# Patient Record
Sex: Female | Born: 1975 | Race: White | Hispanic: No | Marital: Married | Smoking: Never smoker
Health system: Southern US, Community
[De-identification: ages and names within clinical notes are randomized; demographics above are authoritative.]

## PROBLEM LIST (undated history)

## (undated) DIAGNOSIS — S61219A Laceration without foreign body of unspecified finger without damage to nail, initial encounter: Secondary | ICD-10-CM

## (undated) DIAGNOSIS — K219 Gastro-esophageal reflux disease without esophagitis: Secondary | ICD-10-CM

## (undated) HISTORY — PX: WISDOM TOOTH EXTRACTION: SHX21

---

## 2017-04-06 ENCOUNTER — Other Ambulatory Visit: Payer: Self-pay | Admitting: Obstetrics & Gynecology

## 2017-04-06 DIAGNOSIS — Z1231 Encounter for screening mammogram for malignant neoplasm of breast: Secondary | ICD-10-CM

## 2017-04-27 ENCOUNTER — Ambulatory Visit
Admission: RE | Admit: 2017-04-27 | Discharge: 2017-04-27 | Disposition: A | Discharge status: Home / Self Care | Payer: No Typology Code available for payment source | Source: Ambulatory Visit | Attending: Obstetrics & Gynecology | Admitting: Obstetrics & Gynecology

## 2017-04-27 DIAGNOSIS — Z1231 Encounter for screening mammogram for malignant neoplasm of breast: Secondary | ICD-10-CM

## 2018-05-31 ENCOUNTER — Other Ambulatory Visit: Payer: Self-pay | Admitting: Obstetrics & Gynecology

## 2018-05-31 DIAGNOSIS — Z1231 Encounter for screening mammogram for malignant neoplasm of breast: Secondary | ICD-10-CM

## 2018-06-11 ENCOUNTER — Other Ambulatory Visit: Payer: Self-pay

## 2018-06-11 ENCOUNTER — Ambulatory Visit
Admission: RE | Admit: 2018-06-11 | Discharge: 2018-06-11 | Disposition: A | Discharge status: Home / Self Care | Payer: No Typology Code available for payment source | Source: Ambulatory Visit | Attending: Obstetrics & Gynecology | Admitting: Obstetrics & Gynecology

## 2018-06-11 DIAGNOSIS — Z1231 Encounter for screening mammogram for malignant neoplasm of breast: Secondary | ICD-10-CM

## 2018-06-12 ENCOUNTER — Other Ambulatory Visit: Payer: Self-pay | Admitting: Obstetrics & Gynecology

## 2018-06-12 DIAGNOSIS — R928 Other abnormal and inconclusive findings on diagnostic imaging of breast: Secondary | ICD-10-CM

## 2018-06-13 ENCOUNTER — Ambulatory Visit
Admission: RE | Admit: 2018-06-13 | Discharge: 2018-06-13 | Disposition: A | Discharge status: Home / Self Care | Payer: No Typology Code available for payment source | Source: Ambulatory Visit | Attending: Obstetrics & Gynecology | Admitting: Obstetrics & Gynecology

## 2018-06-13 ENCOUNTER — Other Ambulatory Visit: Payer: Self-pay

## 2018-06-13 ENCOUNTER — Other Ambulatory Visit: Payer: Self-pay | Admitting: Obstetrics & Gynecology

## 2018-06-13 DIAGNOSIS — N6489 Other specified disorders of breast: Secondary | ICD-10-CM

## 2018-06-13 DIAGNOSIS — R928 Other abnormal and inconclusive findings on diagnostic imaging of breast: Secondary | ICD-10-CM

## 2018-06-17 ENCOUNTER — Other Ambulatory Visit: Payer: Self-pay

## 2018-06-17 ENCOUNTER — Ambulatory Visit
Admission: RE | Admit: 2018-06-17 | Discharge: 2018-06-17 | Disposition: A | Discharge status: Home / Self Care | Payer: No Typology Code available for payment source | Source: Ambulatory Visit | Attending: Obstetrics & Gynecology | Admitting: Obstetrics & Gynecology

## 2018-06-17 DIAGNOSIS — N6489 Other specified disorders of breast: Secondary | ICD-10-CM

## 2020-07-26 IMAGING — MG DIGITAL SCREENING BILATERAL MAMMOGRAM WITH TOMO AND CAD
8 series · 8 of 24 positions shown · non-contrast
Comparison: Previous exam(s).

CLINICAL DATA: Screening.

EXAM:
DIGITAL SCREENING BILATERAL MAMMOGRAM WITH TOMO AND CAD

[L CC synth-2D]
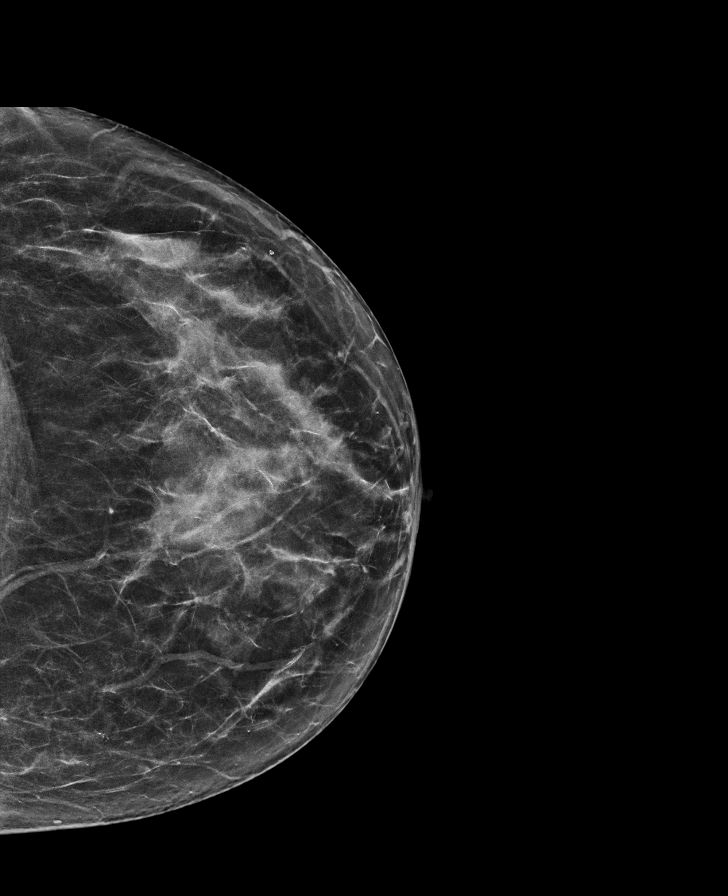

[L MLO synth-2D]
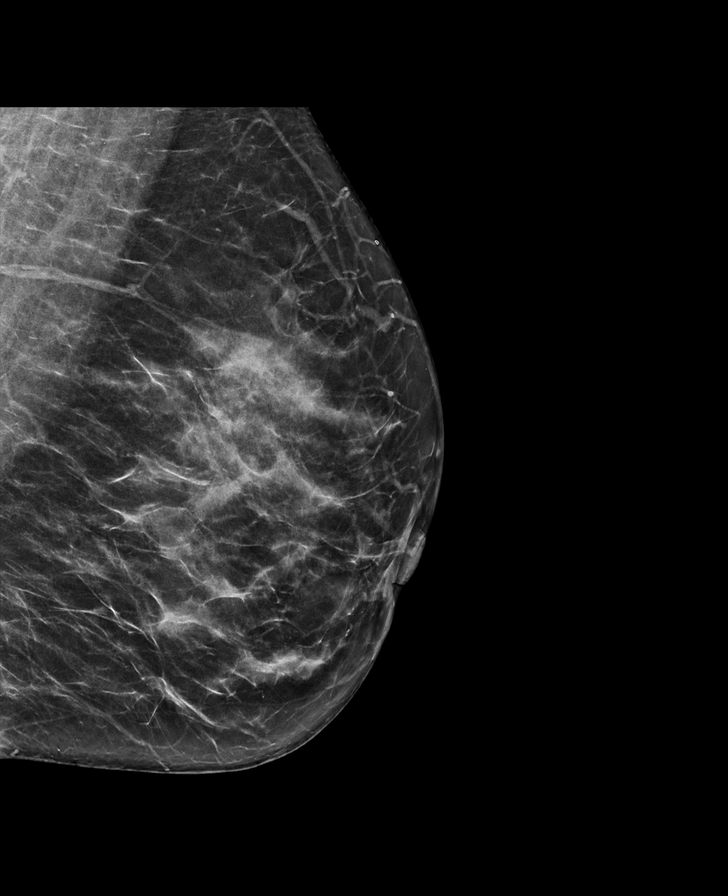

[R MLO synth-2D]
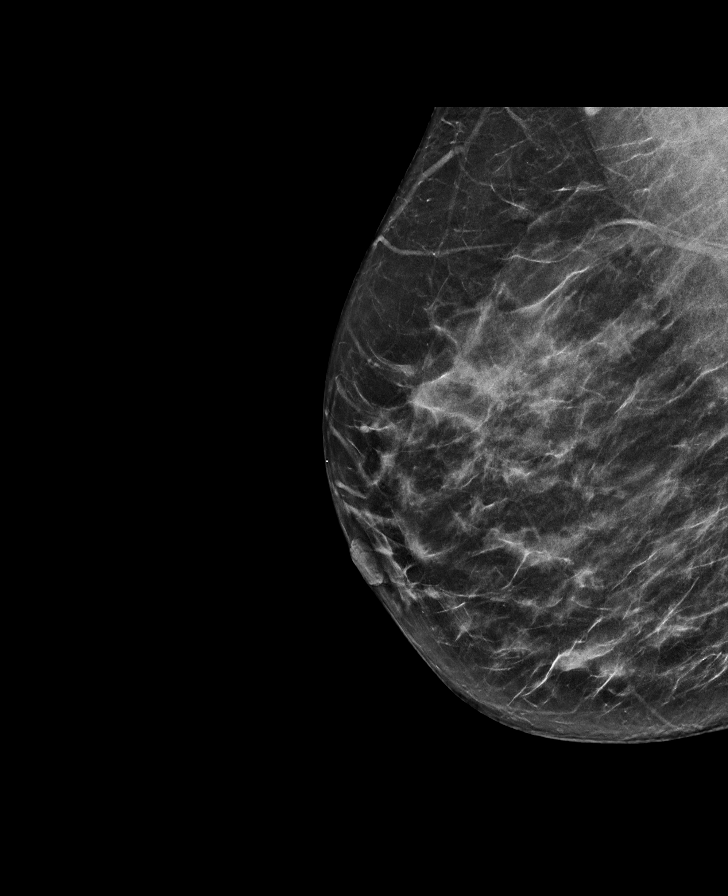

[R CC synth-2D]
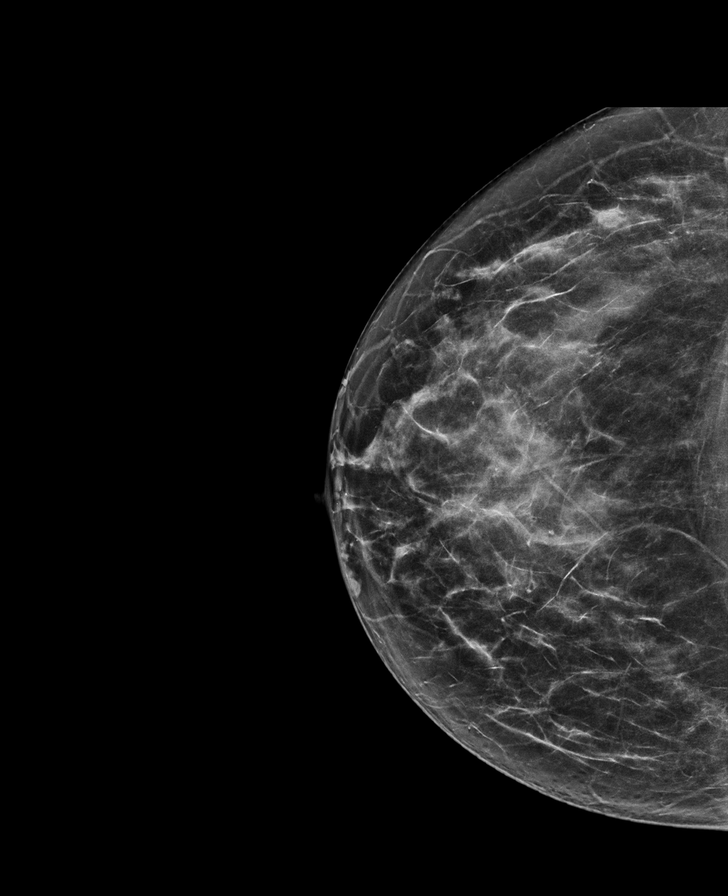

[R CC tomo · tomo slice 35/68.0]
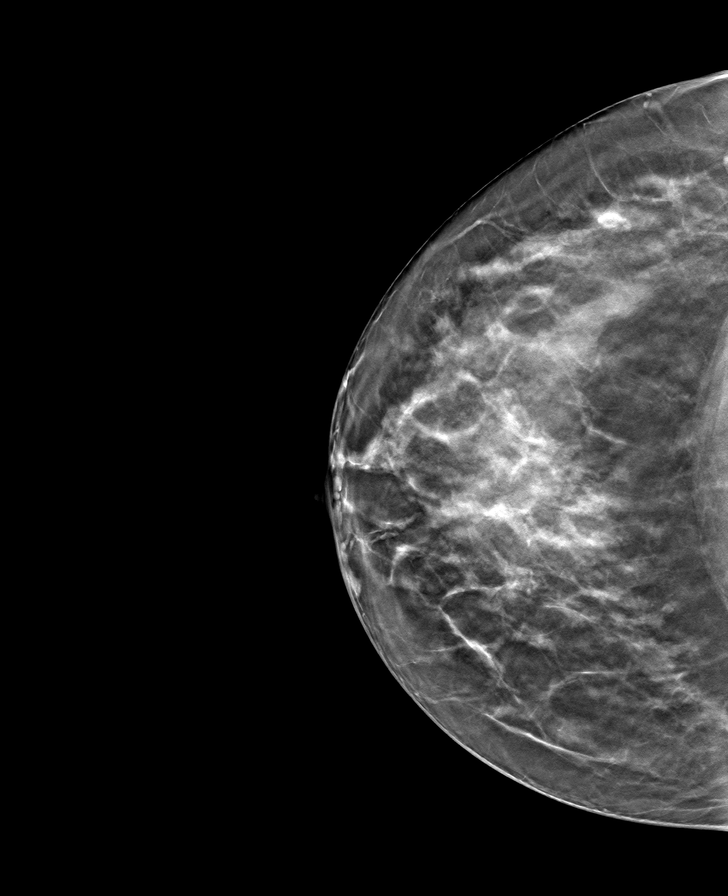

[L MLO tomo · tomo slice 33/65.0]
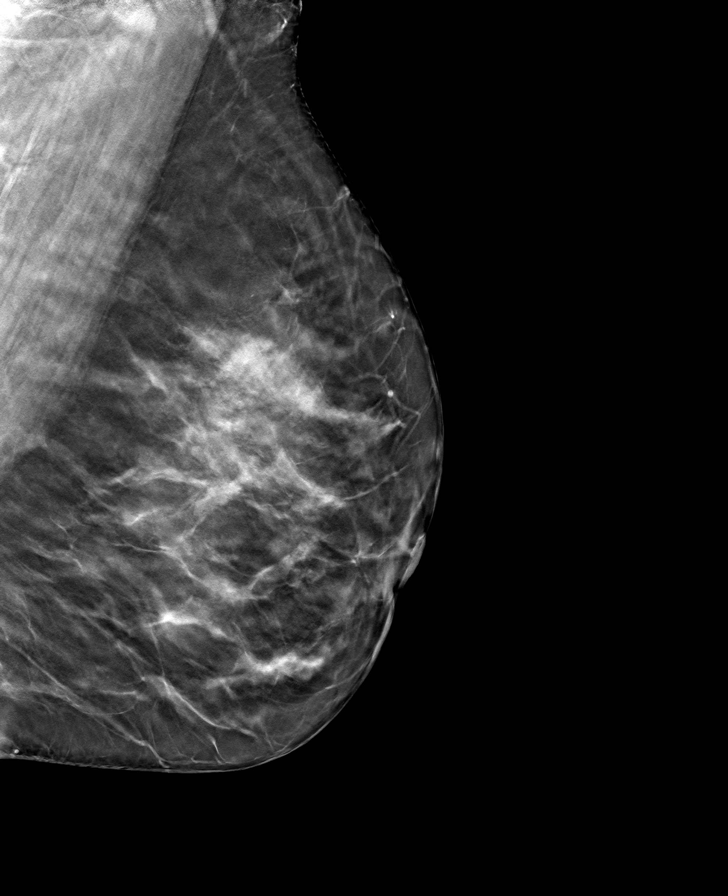

[R MLO tomo · tomo slice 34/67.0]
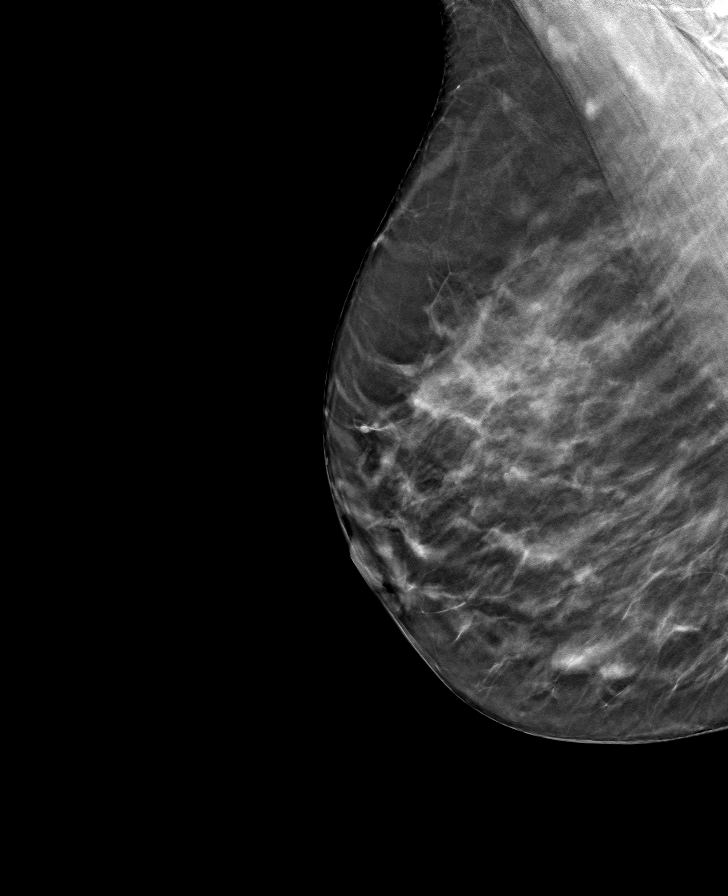

[L CC tomo · tomo slice 34/67.0]
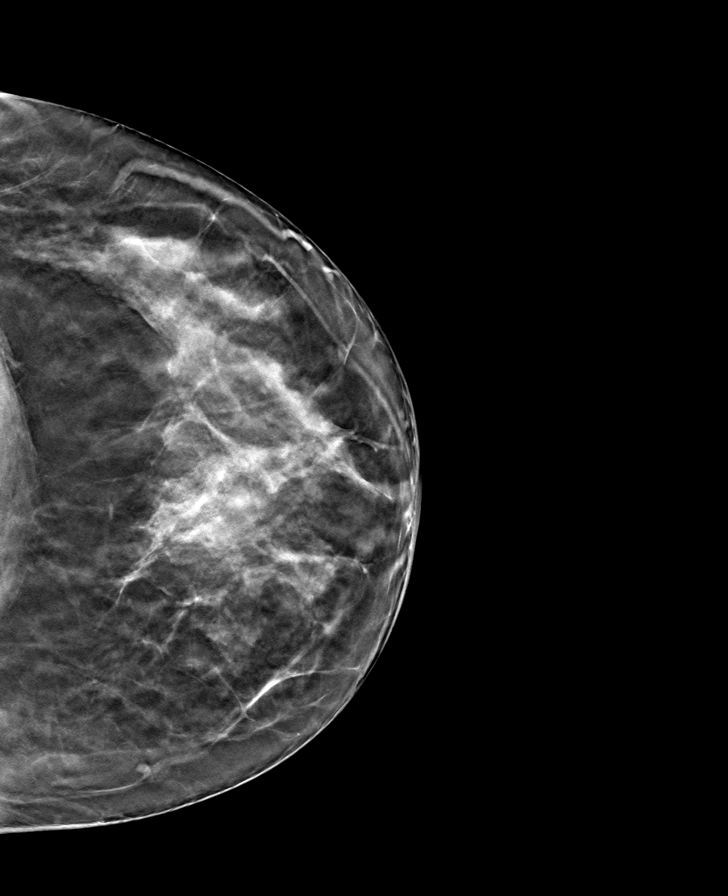

[8 of 24 positions shown; findings below may reference images not displayed]

ACR Breast Density Category c: The breast tissue is heterogeneously
dense, which may obscure small masses.
FINDINGS: In the left breast, possible distortion warrants further evaluation.
In the right breast, no findings suspicious for malignancy. Images
were processed with CAD.
IMPRESSION: Further evaluation is suggested for possible distortion in the left
breast.

RECOMMENDATION:
Diagnostic mammogram and possibly ultrasound of the left breast.
(Code:[id number])

The patient will be contacted regarding the findings, and additional
imaging will be scheduled.

BI-RADS CATEGORY  0: Incomplete. Need additional imaging evaluation
and/or prior mammograms for comparison.

## 2022-06-24 ENCOUNTER — Ambulatory Visit
Admission: RE | Admit: 2022-06-24 | Discharge: 2022-06-24 | Disposition: A | Discharge status: Home / Self Care | Payer: No Typology Code available for payment source | Source: Ambulatory Visit | Attending: Physician Assistant | Admitting: Physician Assistant

## 2022-06-24 VITALS — BP 131/82 | HR 92 | Temp 98.2°F | Resp 18 | Wt 173.0 lb

## 2022-06-24 DIAGNOSIS — Z2839 Other underimmunization status: Secondary | ICD-10-CM

## 2022-06-24 DIAGNOSIS — S61216A Laceration without foreign body of right little finger without damage to nail, initial encounter: Secondary | ICD-10-CM

## 2022-06-24 DIAGNOSIS — Z23 Encounter for immunization: Secondary | ICD-10-CM

## 2022-06-24 MED ORDER — TETANUS-DIPHTH-ACELL PERTUSSIS 5-2.5-18.5 LF-MCG/0.5 IM SUSY
0.5000 mL | PREFILLED_SYRINGE | Freq: Once | INTRAMUSCULAR | Status: AC
  Administered 2022-06-24: 0.5 mL via INTRAMUSCULAR

## 2022-06-24 NOTE — ED Provider Notes (Signed)
[facility]    CSN: [id number] Arrival date & time: 06/24/22  1117      History   Chief Complaint Chief Complaint  Patient presents with   Finger Injury    Need a twtnus shot - Entered by patient    HPI Keller Mikels is a 47 y.o. female presenting for Tdap immunization.  She reports she cut her finger with shears 5 days ago.  Reports that she saw orthopedic surgeon yesterday and is supposed to have an upcoming surgery next Wednesday because she has damage to her nerve of the finger that was lacerated which is the right fifth digit.  Reports she has been keeping the area clean.  She currently has Steri-Strips over the laceration.  She says she is only having any pain and has full range of motion of her finger.  Last tetanus immunization greater than 12 years ago and surgeon advised her to get a tetanus immunization before the surgery.  HPI  History reviewed. No pertinent past medical history.  There are no problems to display for this patient.   History reviewed. No pertinent surgical history.  OB History   No obstetric history on file.      Home Medications    Prior to Admission medications   Not on File    Family History Family History  Problem Relation Age of Onset   Breast cancer Maternal Grandmother 46    Social History Social History   Tobacco Use   Smoking status: Never    Passive exposure: Never   Smokeless tobacco: Never  Substance Use Topics   Alcohol use: Never   Drug use: Never     Allergies   Naproxen and Penicillins   Review of Systems Review of Systems  Musculoskeletal:  Positive for arthralgias. Negative for joint swelling.  Skin:  Positive for wound. Negative for color change.  Neurological:  Positive for numbness. Negative for weakness.     Physical Exam Triage Vital Signs ED Triage Vitals  Enc Vitals Group     BP 06/24/22 1151 131/82     Pulse Rate 06/24/22 1151 92     Resp 06/24/22 1151 18     Temp  06/24/22 1151 98.2 F (36.8 C)     Temp Source 06/24/22 1151 Oral     SpO2 06/24/22 1151 100 %     Weight 06/24/22 1148 173 lb (78.5 kg)     Height --      Head Circumference --      Peak Flow --      Pain Score --      Pain Loc --      Pain Edu? --      Excl. in GC? --    No data found.  Updated Vital Signs BP 131/82 (BP Location: Right Arm)   Pulse 92   Temp 98.2 F (36.8 C) (Oral)   Resp 18   Wt 173 lb (78.5 kg)   SpO2 100%      Physical Exam Vitals and nursing note reviewed.  Constitutional:      General: She is not in acute distress.    Appearance: Normal appearance. She is not ill-appearing or toxic-appearing.  HENT:     Head: Normocephalic and atraumatic.  Eyes:     General: No scleral icterus.       Right eye: No discharge.        Left eye: No discharge.     Conjunctiva/sclera: Conjunctivae normal.  Cardiovascular:  Rate and Rhythm: Normal rate and regular rhythm.     Pulses: Normal pulses.  Pulmonary:     Effort: Pulmonary effort is normal. No respiratory distress.  Musculoskeletal:     Cervical back: Neck supple.     Comments: Right fifth finger: 1.5 cm laceration noted without erythema or drainage.  Full range of motion of finger.  Reduced sensation at tip of finger.  Skin:    General: Skin is dry.  Neurological:     General: No focal deficit present.     Mental Status: She is alert. Mental status is at baseline.     Motor: No weakness.     Gait: Gait normal.  Psychiatric:        Mood and Affect: Mood normal.        Behavior: Behavior normal.        Thought Content: Thought content normal.      UC Treatments / Results  Labs (all labs ordered are listed, but only abnormal results are displayed) Labs Reviewed - No data to display  EKG   Radiology No results found.  Procedures Procedures (including critical care time)  Medications Ordered in UC Medications  Tdap (BOOSTRIX) injection 0.5 mL (0.5 mLs Intramuscular Given 06/24/22  1222)    Initial Impression / Assessment and Plan / UC Course  I have reviewed the triage vital signs and the nursing notes.  Pertinent labs & imaging results that were available during my care of the patient were reviewed by me and considered in my medical decision making (see chart for details).   47 year old female presents for Tdap immunization.  She is following up with orthopedic surgery for surgery of her right fifth finger due to nerve damage from a laceration that she sustained when she cut it with shears 5 days ago.  No signs of infection.  Tetanus immunization given.  Patient advised to continue following up with orthopedic surgery.   Final Clinical Impressions(s) / UC Diagnoses   Final diagnoses:  Not up to date with tetanus toxoid immunization  Laceration of right little finger without foreign body without damage to nail, initial encounter     Discharge Instructions      -Follow up with ortho surgery.    ED Prescriptions   None    PDMP not reviewed this encounter.   Shirlee Latch, PA-C 06/24/22 1247

## 2022-06-24 NOTE — Discharge Instructions (Signed)
Follow up with ortho surgery

## 2022-06-24 NOTE — ED Triage Notes (Signed)
Cut finger on sheers. Pinky on right finger. She goes for surgery next week and dr advised to come get tdap.

## 2022-06-25 ENCOUNTER — Ambulatory Visit: Payer: Self-pay

## 2022-06-30 ENCOUNTER — Encounter (HOSPITAL_BASED_OUTPATIENT_CLINIC_OR_DEPARTMENT_OTHER): Payer: Self-pay | Admitting: Orthopedic Surgery

## 2022-06-30 ENCOUNTER — Other Ambulatory Visit: Payer: Self-pay

## 2022-07-05 ENCOUNTER — Ambulatory Visit (HOSPITAL_BASED_OUTPATIENT_CLINIC_OR_DEPARTMENT_OTHER): Payer: Self-pay | Admitting: Anesthesiology

## 2022-07-05 ENCOUNTER — Encounter (HOSPITAL_BASED_OUTPATIENT_CLINIC_OR_DEPARTMENT_OTHER): Payer: Self-pay | Admitting: Orthopedic Surgery

## 2022-07-05 ENCOUNTER — Ambulatory Visit (HOSPITAL_BASED_OUTPATIENT_CLINIC_OR_DEPARTMENT_OTHER)
Admission: RE | Admit: 2022-07-05 | Discharge: 2022-07-05 | Disposition: A | Discharge status: Home / Self Care | Payer: Self-pay | Attending: Orthopedic Surgery | Admitting: Orthopedic Surgery

## 2022-07-05 ENCOUNTER — Other Ambulatory Visit: Payer: Self-pay

## 2022-07-05 ENCOUNTER — Encounter (HOSPITAL_BASED_OUTPATIENT_CLINIC_OR_DEPARTMENT_OTHER)
Admission: RE | Disposition: A | Discharge status: Home / Self Care | Payer: Self-pay | Source: Home / Self Care | Attending: Orthopedic Surgery

## 2022-07-05 DIAGNOSIS — S61216A Laceration without foreign body of right little finger without damage to nail, initial encounter: Secondary | ICD-10-CM

## 2022-07-05 DIAGNOSIS — X58XXXA Exposure to other specified factors, initial encounter: Secondary | ICD-10-CM | POA: Insufficient documentation

## 2022-07-05 DIAGNOSIS — R202 Paresthesia of skin: Secondary | ICD-10-CM | POA: Insufficient documentation

## 2022-07-05 DIAGNOSIS — R200 Anesthesia of skin: Secondary | ICD-10-CM | POA: Insufficient documentation

## 2022-07-05 DIAGNOSIS — Z01818 Encounter for other preprocedural examination: Secondary | ICD-10-CM

## 2022-07-05 HISTORY — DX: Laceration without foreign body of unspecified finger without damage to nail, initial encounter: S61.219A

## 2022-07-05 HISTORY — DX: Gastro-esophageal reflux disease without esophagitis: K21.9

## 2022-07-05 HISTORY — PX: WOUND EXPLORATION: SHX6188

## 2022-07-05 LAB — POCT PREGNANCY, URINE: Preg Test, Ur: NEGATIVE

## 2022-07-05 SURGERY — WOUND EXPLORATION
Anesthesia: Monitor Anesthesia Care | Site: Finger | Laterality: Right

## 2022-07-05 MED ORDER — 0.9 % SODIUM CHLORIDE (POUR BTL) OPTIME
TOPICAL | Status: DC | PRN
  Administered 2022-07-05: 150 mL

## 2022-07-05 MED ORDER — LACTATED RINGERS IV SOLN: INTRAVENOUS | Status: DC

## 2022-07-05 MED ORDER — BUPIVACAINE HCL (PF) 0.25 % IJ SOLN
INTRAMUSCULAR | Status: DC | PRN
  Administered 2022-07-05: 5 mL

## 2022-07-05 MED ORDER — CEFAZOLIN SODIUM-DEXTROSE 2-4 GM/100ML-% IV SOLN
INTRAVENOUS | Status: AC
  Filled 2022-07-05: qty 100

## 2022-07-05 MED ORDER — FENTANYL CITRATE (PF) 100 MCG/2ML IJ SOLN: 25.0000 ug | INTRAMUSCULAR | Status: DC | PRN

## 2022-07-05 MED ORDER — ACETAMINOPHEN 500 MG PO TABS
1000.0000 mg | ORAL_TABLET | Freq: Once | ORAL | Status: AC
  Administered 2022-07-05: 1000 mg via ORAL

## 2022-07-05 MED ORDER — CEFAZOLIN SODIUM-DEXTROSE 2-4 GM/100ML-% IV SOLN
2.0000 g | INTRAVENOUS | Status: AC
  Administered 2022-07-05: 2 g via INTRAVENOUS

## 2022-07-05 MED ORDER — MIDAZOLAM HCL 2 MG/2ML IJ SOLN
INTRAMUSCULAR | Status: AC
  Filled 2022-07-05: qty 2

## 2022-07-05 MED ORDER — PROPOFOL 10 MG/ML IV BOLUS
INTRAVENOUS | Status: DC | PRN
  Administered 2022-07-05: 20 mg via INTRAVENOUS

## 2022-07-05 MED ORDER — LIDOCAINE HCL (PF) 1 % IJ SOLN
INTRAMUSCULAR | Status: AC
  Filled 2022-07-05: qty 30

## 2022-07-05 MED ORDER — DEXMEDETOMIDINE HCL IN NACL 80 MCG/20ML IV SOLN
INTRAVENOUS | Status: DC | PRN
  Administered 2022-07-05: 8 ug via INTRAVENOUS

## 2022-07-05 MED ORDER — ACETAMINOPHEN 500 MG PO TABS
ORAL_TABLET | ORAL | Status: AC
  Filled 2022-07-05: qty 2

## 2022-07-05 MED ORDER — OXYCODONE HCL 5 MG/5ML PO SOLN: 5.0000 mg | Freq: Once | ORAL | Status: DC | PRN

## 2022-07-05 MED ORDER — AMISULPRIDE (ANTIEMETIC) 5 MG/2ML IV SOLN
10.0000 mg | Freq: Once | INTRAVENOUS | Status: AC | PRN
  Administered 2022-07-05: 10 mg via INTRAVENOUS

## 2022-07-05 MED ORDER — FENTANYL CITRATE (PF) 100 MCG/2ML IJ SOLN
INTRAMUSCULAR | Status: AC
  Filled 2022-07-05: qty 2

## 2022-07-05 MED ORDER — LIDOCAINE HCL 1 % IJ SOLN
INTRAMUSCULAR | Status: DC | PRN
  Administered 2022-07-05: 5 mL

## 2022-07-05 MED ORDER — AMISULPRIDE (ANTIEMETIC) 5 MG/2ML IV SOLN
INTRAVENOUS | Status: AC
  Filled 2022-07-05: qty 4

## 2022-07-05 MED ORDER — FENTANYL CITRATE (PF) 100 MCG/2ML IJ SOLN
INTRAMUSCULAR | Status: DC | PRN
  Administered 2022-07-05 (×2): 25 ug via INTRAVENOUS

## 2022-07-05 MED ORDER — PROPOFOL 500 MG/50ML IV EMUL
INTRAVENOUS | Status: DC | PRN
  Administered 2022-07-05: 75 ug/kg/min via INTRAVENOUS

## 2022-07-05 MED ORDER — OXYCODONE HCL 5 MG PO TABS: 5.0000 mg | ORAL_TABLET | Freq: Four times a day (QID) | ORAL | 0 refills | Status: AC | PRN

## 2022-07-05 MED ORDER — ONDANSETRON HCL 4 MG/2ML IJ SOLN
INTRAMUSCULAR | Status: DC | PRN
  Administered 2022-07-05: 4 mg via INTRAVENOUS

## 2022-07-05 MED ORDER — LIDOCAINE 2% (20 MG/ML) 5 ML SYRINGE
INTRAMUSCULAR | Status: DC | PRN
  Administered 2022-07-05: 2 mg via INTRAVENOUS

## 2022-07-05 MED ORDER — OXYCODONE HCL 5 MG PO TABS: 5.0000 mg | ORAL_TABLET | Freq: Once | ORAL | Status: DC | PRN

## 2022-07-05 SURGICAL SUPPLY — 71 items
APL PRP STRL LF DISP 70% ISPRP (MISCELLANEOUS) ×1
BLADE MINI RND TIP GREEN BEAV (BLADE) IMPLANT
BLADE SURG 15 STRL LF DISP TIS (BLADE) ×2 IMPLANT
BLADE SURG 15 STRL SS (BLADE) ×1
BNDG CMPR 5X2 KNTD ELC UNQ LF (GAUZE/BANDAGES/DRESSINGS)
BNDG CMPR 5X3 KNIT ELC UNQ LF (GAUZE/BANDAGES/DRESSINGS) ×1
BNDG CMPR 9X4 STRL LF SNTH (GAUZE/BANDAGES/DRESSINGS) ×1
BNDG ELASTIC 2INX 5YD STR LF (GAUZE/BANDAGES/DRESSINGS) IMPLANT
BNDG ELASTIC 3INX 5YD STR LF (GAUZE/BANDAGES/DRESSINGS) ×1 IMPLANT
BNDG ESMARK 4X9 LF (GAUZE/BANDAGES/DRESSINGS) ×1 IMPLANT
BNDG GAUZE DERMACEA FLUFF 4 (GAUZE/BANDAGES/DRESSINGS) ×1 IMPLANT
BNDG GZE DERMACEA 4 6PLY (GAUZE/BANDAGES/DRESSINGS) ×1
CATH ROBINSON RED A/P 10FR (CATHETERS) IMPLANT
CHLORAPREP W/TINT 26 (MISCELLANEOUS) ×1 IMPLANT
CORD BIPOLAR FORCEPS 12FT (ELECTRODE) ×1 IMPLANT
COVER BACK TABLE 60X90IN (DRAPES) ×1 IMPLANT
COVER MAYO STAND STRL (DRAPES) ×1 IMPLANT
CUFF TOURN SGL QUICK 18X4 (TOURNIQUET CUFF) ×1 IMPLANT
DRAPE EXTREMITY T 121X128X90 (DISPOSABLE) ×1 IMPLANT
DRAPE OEC MINIVIEW 54X84 (DRAPES) IMPLANT
DRAPE SURG 17X23 STRL (DRAPES) ×1 IMPLANT
GAUZE 4X4 16PLY ~~LOC~~+RFID DBL (SPONGE) IMPLANT
GAUZE PAD ABD 8X10 STRL (GAUZE/BANDAGES/DRESSINGS) IMPLANT
GAUZE XEROFORM 1X8 LF (GAUZE/BANDAGES/DRESSINGS) ×1 IMPLANT
GLOVE BIO SURGEON STRL SZ7 (GLOVE) ×1 IMPLANT
GLOVE BIOGEL PI IND STRL 7.0 (GLOVE) ×1 IMPLANT
GOWN STRL REUS W/ TWL LRG LVL3 (GOWN DISPOSABLE) ×2 IMPLANT
GOWN STRL REUS W/TWL LRG LVL3 (GOWN DISPOSABLE) ×3
LOOP VASCLR MAXI BLUE 18IN ST (MISCELLANEOUS) IMPLANT
LOOP VASCULAR MAXI 18 BLUE (MISCELLANEOUS)
LOOPS VASCLR MAXI BLUE 18IN ST (MISCELLANEOUS) IMPLANT
NDL HYPO 25X1 1.5 SAFETY (NEEDLE) IMPLANT
NDL KEITH (NEEDLE) IMPLANT
NEEDLE HYPO 25X1 1.5 SAFETY (NEEDLE) ×1 IMPLANT
NEEDLE KEITH (NEEDLE) IMPLANT
NS IRRIG 1000ML POUR BTL (IV SOLUTION) ×1 IMPLANT
PACK BASIN DAY SURGERY FS (CUSTOM PROCEDURE TRAY) ×1 IMPLANT
PAD CAST 3X4 CTTN HI CHSV (CAST SUPPLIES) ×1 IMPLANT
PAD CAST 4YDX4 CTTN HI CHSV (CAST SUPPLIES) IMPLANT
PADDING CAST ABS COTTON 3X4 (CAST SUPPLIES) IMPLANT
PADDING CAST ABS COTTON 4X4 ST (CAST SUPPLIES) ×1 IMPLANT
PADDING CAST COTTON 3X4 STRL (CAST SUPPLIES) ×1
PADDING CAST COTTON 4X4 STRL (CAST SUPPLIES)
SHEET MEDIUM DRAPE 40X70 STRL (DRAPES) IMPLANT
SLEEVE SCD COMPRESS KNEE MED (STOCKING) IMPLANT
SPIKE FLUID TRANSFER (MISCELLANEOUS) IMPLANT
SPLINT FIBERGLASS 4X30 (CAST SUPPLIES) ×1 IMPLANT
SPLINT PLASTER CAST XFAST 3X15 (CAST SUPPLIES) IMPLANT
SUT ETHIBOND 3-0 V-5 (SUTURE) IMPLANT
SUT ETHILON 3 0 PS 1 (SUTURE) IMPLANT
SUT ETHILON 4 0 PS 2 18 (SUTURE) ×1 IMPLANT
SUT ETHILON 8 0 BV130 4 (SUTURE) IMPLANT
SUT FIBERWIRE 3-0 18 TAPR NDL (SUTURE)
SUT FIBERWIRE 4-0 18 DIAM BLUE (SUTURE)
SUT FIBERWIRE 4-0 18 TAPR NDL (SUTURE)
SUT MNCRL AB 4-0 PS2 18 (SUTURE) IMPLANT
SUT MON AB 5-0 PS2 18 (SUTURE) IMPLANT
SUT NYLON 9 0 VRM6 (SUTURE) IMPLANT
SUT PROLENE 6 0 P 1 18 (SUTURE) IMPLANT
SUT SILK 2 0 PERMA HAND 18 BK (SUTURE) IMPLANT
SUT SILK 4 0 PS 2 (SUTURE) IMPLANT
SUT SUPRAMID 4-0 (SUTURE) IMPLANT
SUT VIC AB 4-0 PS2 18 (SUTURE) IMPLANT
SUTURE FIBERWR 3-0 18 TAPR NDL (SUTURE) IMPLANT
SUTURE FIBERWR 4-0 18 DIA BLUE (SUTURE) IMPLANT
SUTURE FIBERWR 4-0 18 TAPR NDL (SUTURE) IMPLANT
SYR BULB EAR ULCER 3OZ GRN STR (SYRINGE) ×1 IMPLANT
SYR CONTROL 10ML LL (SYRINGE) IMPLANT
TOWEL GREEN STERILE FF (TOWEL DISPOSABLE) ×2 IMPLANT
UNDERPAD 30X36 HEAVY ABSORB (UNDERPADS AND DIAPERS) ×1 IMPLANT
VASCULAR TIE MAXI BLUE 18IN ST (MISCELLANEOUS)

## 2022-07-05 NOTE — Transfer of Care (Signed)
Immediate Anesthesia Transfer of Care Note  Patient: Sydney Allen  Procedure(s) Performed: right small finger wound exploration and nerve and tendon repair as indicated neurolysis of radial and ulnar digital nerve (Right: Finger)  Patient Location: PACU  Anesthesia Type:MAC  Level of Consciousness: awake, alert , oriented, and patient cooperative  Airway & Oxygen Therapy: Patient Spontanous Breathing  Post-op Assessment: Report given to RN and Post -op Vital signs reviewed and stable  Post vital signs: Reviewed and stable  Last Vitals:  Vitals Value Taken Time  BP    Temp    Pulse    Resp    SpO2      Last Pain:  Vitals:   07/05/22 0952  TempSrc: Temporal  PainSc: 0-No pain      Patients Stated Pain Goal: 5 (07/05/22 0952)  Complications: No notable events documented.

## 2022-07-05 NOTE — Anesthesia Postprocedure Evaluation (Signed)
Anesthesia Post Note  Patient: Sydney Allen  Procedure(s) Performed: right small finger wound exploration and nerve and tendon repair as indicated neurolysis of radial and ulnar digital nerve (Right: Finger)     Patient location during evaluation: PACU Anesthesia Type: MAC Level of consciousness: awake Pain management: pain level controlled Vital Signs Assessment: post-procedure vital signs reviewed and stable Respiratory status: spontaneous breathing, nonlabored ventilation and respiratory function stable Cardiovascular status: stable and blood pressure returned to baseline Postop Assessment: no apparent nausea or vomiting Anesthetic complications: no   No notable events documented.  Last Vitals:  Vitals:   07/05/22 1315 07/05/22 1334  BP: 139/86 (!) 164/98  Pulse: 73 82  Resp: 17 16  Temp:  36.6 C  SpO2: 99% 98%    Last Pain:  Vitals:   07/05/22 1334  TempSrc: Oral  PainSc: 0-No pain                 Linton Rump

## 2022-07-05 NOTE — Discharge Instructions (Addendum)
Waylan Rocher, M.D. Hand Surgery  POST-OPERATIVE DISCHARGE INSTRUCTIONS   PRESCRIPTIONS: You may have been given a prescription to be taken as directed for post-operative pain control.  You may also take over the counter ibuprofen/aleve and tylenol for pain. Take this as directed on the packaging. Do not exceed 3000 mg tylenol/acetaminophen in 24 hours.  Ibuprofen 600-800 mg (3-4) tablets by mouth every 6 hours as needed for pain.  OR Aleve 2 tablets by mouth every 12 hours (twice daily) as needed for pain.  AND/OR Tylenol 1000 mg (2 tablets) every 8 hours as needed for pain.  Please use your pain medication carefully, as refills are limited and you may not be provided with one.  As stated above, please use over the counter pain medicine - it will also be helpful with decreasing your swelling.    ANESTHESIA: After your surgery, post-surgical discomfort or pain is likely. This discomfort can last several days to a few weeks. At certain times of the day your discomfort may be more intense.   Did you receive a nerve block?  A nerve block can provide pain relief for one hour to two days after your surgery. As long as the nerve block is working, you will experience little or no sensation in the area the surgeon operated on.  As the nerve block wears off, you will begin to experience pain or discomfort. It is very important that you begin taking your prescribed pain medication before the nerve block fully wears off. Treating your pain at the first sign of the block wearing off will ensure your pain is better controlled and more tolerable when full-sensation returns. Do not wait until the pain is intolerable, as the medicine will be less effective. It is better to treat pain in advance than to try and catch up.   General Anesthesia:  If you did not receive a nerve block during your surgery, you will need to start taking your pain medication shortly after your surgery and should continue  to do so as prescribed by your surgeon.     ICE AND ELEVATION: You may use ice for the first 48-72 hours, but it is not critical.   Motion of your fingers is very important to decrease the swelling.  Elevation, as much as possible for the next 48 hours, is critical for decreasing swelling as well as for pain relief. Elevation means when you are seated or lying down, you hand should be at or above your heart. When walking, the hand needs to be at or above the level of your elbow.  If the bandage gets too tight, it may need to be loosened. Please contact our office and we will instruct you in how to do this.    SURGICAL BANDAGES:  Keep your dressing and/or splint clean and dry at all times.  Do not remove until you are seen again in the office by me or therapy.  If careful, you may place a plastic bag over your bandage and tape the end to shower, but be careful, do not get your bandages wet.     HAND THERAPY:  You will be contacted to set up the date and time of your first therapy appointment.    ACTIVITY AND WORK: You are encouraged to move any fingers which are not in the bandage.  Light use of the fingers is allowed to assist the other hand with daily hygiene and eating, but strong gripping or lifting is often uncomfortable and  should be avoided.  You might miss a variable period of time from work and hopefully this issue has been discussed prior to surgery. You may not do any heavy work with your affected hand for about 2 weeks.    [facility] Second Floor, [address] [place], [place] [address] [phone]    Post Anesthesia Home Care Instructions  Activity: Get plenty of rest for the remainder of the day. A responsible individual must stay with you for 24 hours following the procedure.  For the next 24 hours, DO NOT: -Drive a car -Advertising copywriter -Drink alcoholic beverages -Take any medication unless instructed by your physician -Make any legal decisions or  sign important papers.  Meals: Start with liquid foods such as gelatin or soup. Progress to regular foods as tolerated. Avoid greasy, spicy, heavy foods. If nausea and/or vomiting occur, drink only clear liquids until the nausea and/or vomiting subsides. Call your physician if vomiting continues.  Special Instructions/Symptoms: Your throat may feel dry or sore from the anesthesia or the breathing tube placed in your throat during surgery. If this causes discomfort, gargle with warm salt water. The discomfort should disappear within 24 hours.  If you had a scopolamine patch placed behind your ear for the management of post- operative nausea and/or vomiting:  1. The medication in the patch is effective for 72 hours, after which it should be removed.  Wrap patch in a tissue and discard in the trash. Wash hands thoroughly with soap and water. 2. You may remove the patch earlier than 72 hours if you experience unpleasant side effects which may include dry mouth, dizziness or visual disturbances. 3. Avoid touching the patch. Wash your hands with soap and water after contact with the patch.    *May have Tylenol today at 4pm

## 2022-07-05 NOTE — Anesthesia Procedure Notes (Signed)
Procedure Name: MAC Date/Time: 07/05/2022 11:44 AM  Performed by: Sheryn Bison, CRNAPre-anesthesia Checklist: Patient identified, Emergency Drugs available, Suction available, Patient being monitored and Timeout performed Patient Re-evaluated:Patient Re-evaluated prior to induction Oxygen Delivery Method: Simple face mask

## 2022-07-05 NOTE — Op Note (Signed)
Date of Surgery: 07/05/2022  INDICATIONS: Patient is a 47 y.o.-year-old female with an oblique laceration at the volar aspect of the right small finger over the middle phalanx.  She noticed numbness and paresthesias in the tip of the small finger distal to the laceration.  She was scheduled to undergo surgery last week with one of my partners but had to reschedule due to an illness.  Risks, benefits, and alternatives to surgery were again with the patient in the preoperative area. The patient wishes to proceed with surgery.  Informed consent was signed after our discussion.   PREOPERATIVE DIAGNOSIS:  Right small finger laceration  POSTOPERATIVE DIAGNOSIS: Same.  PROCEDURE:  Exploration of right small finger wound Right radial and ulnar digital neurolysis   SURGEON: Waylan Rocher, M.D.  ASSIST:   ANESTHESIA:  Local + MAC  IV FLUIDS AND URINE: See anesthesia.  ESTIMATED BLOOD LOSS: <5 mL.  IMPLANTS: * No implants in log *   DRAINS: None  COMPLICATIONS: None  DESCRIPTION OF PROCEDURE: The patient was met in the preoperative holding area where the surgical site was marked and the informed consent form was signed.  The patient was then brought back to the operating room and remained on the stretcher.  A hand table was placed adjacent to the operative extremity and locked into place.  A tourniquet was placed on the right forearm.  A formal timeout was performed to confirm that this was the correct patient, surgical side, surgical site, and surgical procedure.  All were present and in agreement. Following formal timeout, a local block was performed using 10 mL of an equal mixture of 1% plain lidocaine and 0.25% plain marcaine.  The right upper extremity was then prepped and draped in the usual and sterile fashion.    Following a second formal timeout, the limb was gently exsanguinated with an Esmarch bandage and the tourniquet inflated to 250 mmHg.  I began by opening her traumatic  wound that was oblique and centered over the middle phalanx in zone 2.  Full-thickness skin flaps were elevated.  The radial and ulnar neurovascular bundles were identified.  There was no obvious injury to the digital nerves at this level so the incision was extended proximally over the proximal phalanx and distally over the finger pulp in [place] fashion.  An extensive neurolysis was performed along the radial digital nerve from over the proximal phalanx well proximal to the traumatic wound into the pad of the finger.  The radial digital nerve was found to be in continuity without evidence of laceration or neuroma in situ.  Similarly, the ulnar digital nerve was explored along the entire length of the wound from the level of the proximal phalanx into the finger pad.  Again, the ulnar digital nerve was found to be in continuity without evidence of laceration or neuroma in situ.  The underlying digital arteries also appear to be intact.  There was no defect in the flexor tendon sheath.  There was no apparent injury to the radial or ulnar digital nerve from well proximal to well distal to the zone of injury.  The wound was then thoroughly irrigated with copious sterile saline.  Was closed using a 4-0 nylon suture in horizontal mattress fashion with simple sutures for the corners of the wound.  The wound was then dressed with Xeroform, folded Kerlix, cast padding, and a well-padded ulnar gutter splint was applied.  The patient was reversed from sedation.  All counts were correct x 2 at  the end of the procedure.  The patient was then taken to the PACU in stable condition.   POSTOPERATIVE PLAN: She will be discharged home with appropriate pain medication and discharge instructions.  I will see her back in 10 to 4 days for her first postop visit.  A referral will be made to hand therapy to help work on active range of motion given the extent of her dissection.  Waylan Rocher, MD 4:00 PM

## 2022-07-05 NOTE — Anesthesia Preprocedure Evaluation (Addendum)
Anesthesia Evaluation  Patient identified by MRN, date of birth, ID band Patient awake    Reviewed: Allergy & Precautions, NPO status , Patient's Chart, lab work & pertinent test results  History of Anesthesia Complications Negative for: history of anesthetic complications  Airway Mallampati: II  TM Distance: >3 FB Neck ROM: Full    Dental  (+) Dental Advisory Given, Teeth Intact   Pulmonary neg pulmonary ROS   Pulmonary exam normal breath sounds clear to auscultation       Cardiovascular negative cardio ROS  Rhythm:Regular Rate:Normal     Neuro/Psych negative neurological ROS     GI/Hepatic Neg liver ROS,GERD  Medicated,,  Endo/Other  negative endocrine ROS    Renal/GU negative Renal ROS     Musculoskeletal   Abdominal   Peds  Hematology negative hematology ROS (+)   Anesthesia Other Findings   Reproductive/Obstetrics                             Anesthesia Physical Anesthesia Plan  ASA: 2  Anesthesia Plan: MAC   Post-op Pain Management: Tylenol PO (pre-op)*   Induction: Intravenous  PONV Risk Score and Plan: 2 and Ondansetron, Dexamethasone, Propofol infusion and Treatment may vary due to age or medical condition  Airway Management Planned: Natural Airway and Simple Face Mask  Additional Equipment:   Intra-op Plan:   Post-operative Plan:   Informed Consent: I have reviewed the patients History and Physical, chart, labs and discussed the procedure including the risks, benefits and alternatives for the proposed anesthesia with the patient or authorized representative who has indicated his/her understanding and acceptance.     Dental advisory given  Plan Discussed with: CRNA and Anesthesiologist  Anesthesia Plan Comments: (Discussed with patient risks of MAC including, but not limited to, minor pain or discomfort, hearing people in the room, and possible need for backup  general anesthesia. Risks for general anesthesia also discussed including, but not limited to, sore throat, hoarse voice, chipped/damaged teeth, injury to vocal cords, nausea and vomiting, allergic reactions, lung infection, heart attack, stroke, and death. All questions answered. )       Anesthesia Quick Evaluation

## 2022-07-05 NOTE — Interval H&P Note (Signed)
History and Physical Interval Note:  07/05/2022 11:18 AM  Sydney Allen  has presented today for surgery, with the diagnosis of :right small finger laceration with nerve involvement.  The various methods of treatment have been discussed with the patient and family. After consideration of risks, benefits and other options for treatment, the patient has consented to  Procedure(s) with comments: right small finger wound exploration and nerve and tendon repair as indicated (Right) - local as a surgical intervention.  The patient's history has been reviewed, patient examined, no change in status, stable for surgery.  I have reviewed the patient's chart and labs.  Questions were answered to the patient's satisfaction.     [name]

## 2022-07-05 NOTE — H&P (Signed)
HAND SURGERY   HPI: Patient is a 47 y.o. female who presents with laceration to the volar aspect of the small finger over the middle phalanx.  This occurred approximately 2 weeks ago.  He has full active range of motion of the finger at the PIP and DIP joint but describes numbness and paresthesias on both the radial and ulnar border of the finger distal to laceration.  He was supposed to undergo surgery with my partners last week, however, she had to cancel due to illness.  Patient denies any changes to their medical history or new systemic symptoms today.    Past Medical History:  Diagnosis Date   Finger laceration    GERD (gastroesophageal reflux disease)    Past Surgical History:  Procedure Laterality Date   WISDOM TOOTH EXTRACTION     Social History   Socioeconomic History   Marital status: Married    Spouse name: Not on file   Number of children: Not on file   Years of education: Not on file   Highest education level: Not on file  Occupational History   Not on file  Tobacco Use   Smoking status: Never    Passive exposure: Never   Smokeless tobacco: Never  Vaping Use   Vaping Use: Never used  Substance and Sexual Activity   Alcohol use: Yes    Comment: occas   Drug use: Never   Sexual activity: Yes    Birth control/protection: None  Other Topics Concern   Not on file  Social History Narrative   Not on file   Social Determinants of Health   Financial Resource Strain: Not on file  Food Insecurity: Not on file  Transportation Needs: Not on file  Physical Activity: Not on file  Stress: Not on file  Social Connections: Not on file   Family History  Problem Relation Age of Onset   Breast cancer Maternal Grandmother 46   - negative except otherwise stated in the family history section Allergies  Allergen Reactions   Erythromycin Hives   Naproxen Swelling   Penicillins Hives   Prior to Admission medications   Medication Sig Start Date End Date Taking?  Authorizing Provider  esomeprazole (NEXIUM) 40 MG capsule Take 40 mg by mouth 2 (two) times daily before a meal.   Yes [provider]   No results found. - Positive ROS: All other systems have been reviewed and were otherwise negative with the exception of those mentioned in the HPI and as above.  Physical Exam: General: No acute distress, resting comfortably Cardiovascular: BUE warm and well perfused, normal rate Respiratory: Normal WOB on RA Skin: Warm and dry Neurologic: Sensation intact distally Psychiatric: Patient is at baseline mood and affect  Right upper Extremity  Well-healed oblique laceration over the volar aspect of the small finger in zone 2.  She has full active range of motion of the small finger at the PIP and DIP joint with intact isolated FDS and FDP function.  She has diminished sensation of both the radial and ulnar aspect of the finger distal to the laceration.  The finger is well-perfused with brisk capillary refill compared to the adjacent fingers.  She has full and painless active range of motion of the remaining fingers with full and intact sensation throughout.   Assessment: 47 year old female with a laceration to the right small finger with concern for both radial and ulnar digital nerve laceration.  The flexor tendons appear to be intact based on exam.  Plan: OR today for exploration of the right small finger with likely repair versus reconstruction of the radial and ulnar digital nerves as indicated. Very thorough discussion with the patient and her husband regarding the nature of her injury and the nature of the nerve repair versus reconstruction.    We described the normal aspects of nerve regeneration.  We reviewed the risks of surgery which include bleeding, infection, persistent numbness or paresthesias, devascularization of the fingertip necessitating further surgery or amputation, delayed wound healing, stiffness, need for formal therapy, need  for additional surgery.  Patient elected to proceed with surgery.  All questions were answered.   Marlyne Beards, M.D. [facility] 11:15 AM

## 2022-07-07 ENCOUNTER — Encounter (HOSPITAL_BASED_OUTPATIENT_CLINIC_OR_DEPARTMENT_OTHER): Payer: Self-pay | Admitting: Orthopedic Surgery
